# Patient Record
Sex: Female | Born: 1975 | Hispanic: Yes | Marital: Single
Health system: Southern US, Community
[De-identification: ages and names within clinical notes are randomized; demographics above are authoritative.]

---

## 2021-02-19 ENCOUNTER — Emergency Department (HOSPITAL_COMMUNITY)
Admission: EM | Admit: 2021-02-19 | Discharge: 2021-02-19 | Disposition: A | Discharge status: Home / Self Care | Payer: Medicaid Other | Attending: Emergency Medicine | Admitting: Emergency Medicine

## 2021-02-19 ENCOUNTER — Emergency Department (HOSPITAL_COMMUNITY): Payer: Medicaid Other

## 2021-02-19 ENCOUNTER — Other Ambulatory Visit: Payer: Self-pay

## 2021-02-19 DIAGNOSIS — R0781 Pleurodynia: Secondary | ICD-10-CM | POA: Insufficient documentation

## 2021-02-19 DIAGNOSIS — Y92410 Unspecified street and highway as the place of occurrence of the external cause: Secondary | ICD-10-CM | POA: Diagnosis not present

## 2021-02-19 DIAGNOSIS — M79641 Pain in right hand: Secondary | ICD-10-CM | POA: Insufficient documentation

## 2021-02-19 MED ORDER — ACETAMINOPHEN 500 MG PO TABS
1000.0000 mg | ORAL_TABLET | Freq: Once | ORAL | Status: AC
  Administered 2021-02-19: 1000 mg via ORAL
  Filled 2021-02-19: qty 2
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
## 2021-02-19 NOTE — Discharge Instructions (Addendum)
Return for any problem.  Use ibuprofen and Tylenol as instructed for treatment of your pain.
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
